# Patient Record
Sex: Female | Born: 1935 | Race: White | Hispanic: No | Marital: Single
Health system: Southern US, Community
[De-identification: ages and names within clinical notes are randomized; demographics above are authoritative.]

## PROBLEM LIST (undated history)

## (undated) DIAGNOSIS — E119 Type 2 diabetes mellitus without complications: Secondary | ICD-10-CM

## (undated) DIAGNOSIS — E079 Disorder of thyroid, unspecified: Secondary | ICD-10-CM

---

## 2007-11-23 ENCOUNTER — Emergency Department: Payer: Self-pay | Admitting: Emergency Medicine

## 2007-12-06 ENCOUNTER — Emergency Department: Payer: Self-pay | Admitting: Unknown Physician Specialty

## 2009-02-14 ENCOUNTER — Emergency Department: Payer: Self-pay | Admitting: Emergency Medicine

## 2010-01-11 ENCOUNTER — Emergency Department: Payer: Self-pay

## 2010-01-12 ENCOUNTER — Observation Stay: Payer: Self-pay | Admitting: Internal Medicine

## 2010-01-29 ENCOUNTER — Ambulatory Visit: Payer: Self-pay | Admitting: Gastroenterology

## 2011-06-25 ENCOUNTER — Emergency Department: Payer: Self-pay | Admitting: *Deleted

## 2013-05-27 ENCOUNTER — Emergency Department: Payer: Self-pay | Admitting: Emergency Medicine

## 2013-05-27 LAB — URINALYSIS, COMPLETE
Bilirubin,UR: NEGATIVE
Glucose,UR: NEGATIVE mg/dL (ref 0–75)
Ph: 5 (ref 4.5–8.0)
Protein: NEGATIVE
Specific Gravity: 1.011 (ref 1.003–1.030)
Squamous Epithelial: 2
WBC UR: 19 /HPF (ref 0–5)

## 2013-05-27 LAB — COMPREHENSIVE METABOLIC PANEL
Albumin: 3.1 g/dL — ABNORMAL LOW (ref 3.4–5.0)
Alkaline Phosphatase: 111 U/L (ref 50–136)
Anion Gap: 7 (ref 7–16)
BUN: 15 mg/dL (ref 7–18)
Bilirubin,Total: 0.7 mg/dL (ref 0.2–1.0)
Calcium, Total: 9.3 mg/dL (ref 8.5–10.1)
Chloride: 105 mmol/L (ref 98–107)
Creatinine: 1.07 mg/dL (ref 0.60–1.30)
EGFR (Non-African Amer.): 50 — ABNORMAL LOW
Osmolality: 276 (ref 275–301)
Potassium: 3.9 mmol/L (ref 3.5–5.1)
SGPT (ALT): 26 U/L (ref 12–78)
Sodium: 137 mmol/L (ref 136–145)
Total Protein: 7.1 g/dL (ref 6.4–8.2)

## 2013-05-27 LAB — CBC
HGB: 14.4 g/dL (ref 12.0–16.0)
MCH: 28.2 pg (ref 26.0–34.0)
MCV: 84 fL (ref 80–100)
Platelet: 205 10*3/uL (ref 150–440)
RBC: 5.08 10*6/uL (ref 3.80–5.20)
RDW: 13.5 % (ref 11.5–14.5)

## 2013-05-27 LAB — TROPONIN I: Troponin-I: <0.02 ng/mL

## 2013-05-27 LAB — TSH: Thyroid Stimulating Horm: 4.79 u[IU]/mL — ABNORMAL HIGH

## 2013-05-27 LAB — PRO B NATRIURETIC PEPTIDE: B-Type Natriuretic Peptide: 154 pg/mL (ref 0–450)

## 2013-07-31 ENCOUNTER — Emergency Department: Payer: Self-pay | Admitting: Emergency Medicine

## 2013-07-31 LAB — URINALYSIS, COMPLETE
Bilirubin,UR: NEGATIVE
Glucose,UR: NEGATIVE mg/dL (ref 0–75)
Ph: 6 (ref 4.5–8.0)
Protein: NEGATIVE
RBC,UR: 9 /HPF (ref 0–5)
Squamous Epithelial: 1

## 2013-08-01 LAB — URINE CULTURE

## 2014-07-30 IMAGING — CR DG CHEST 2V
1 series · 2 of 2 positions shown · non-contrast
Comparison: none

REASON FOR EXAM: weakness, shortness of breath
COMMENTS:

[Series 1: pa · 0.17mm/px · 2 of 2 slices shown]
[im 1/2]
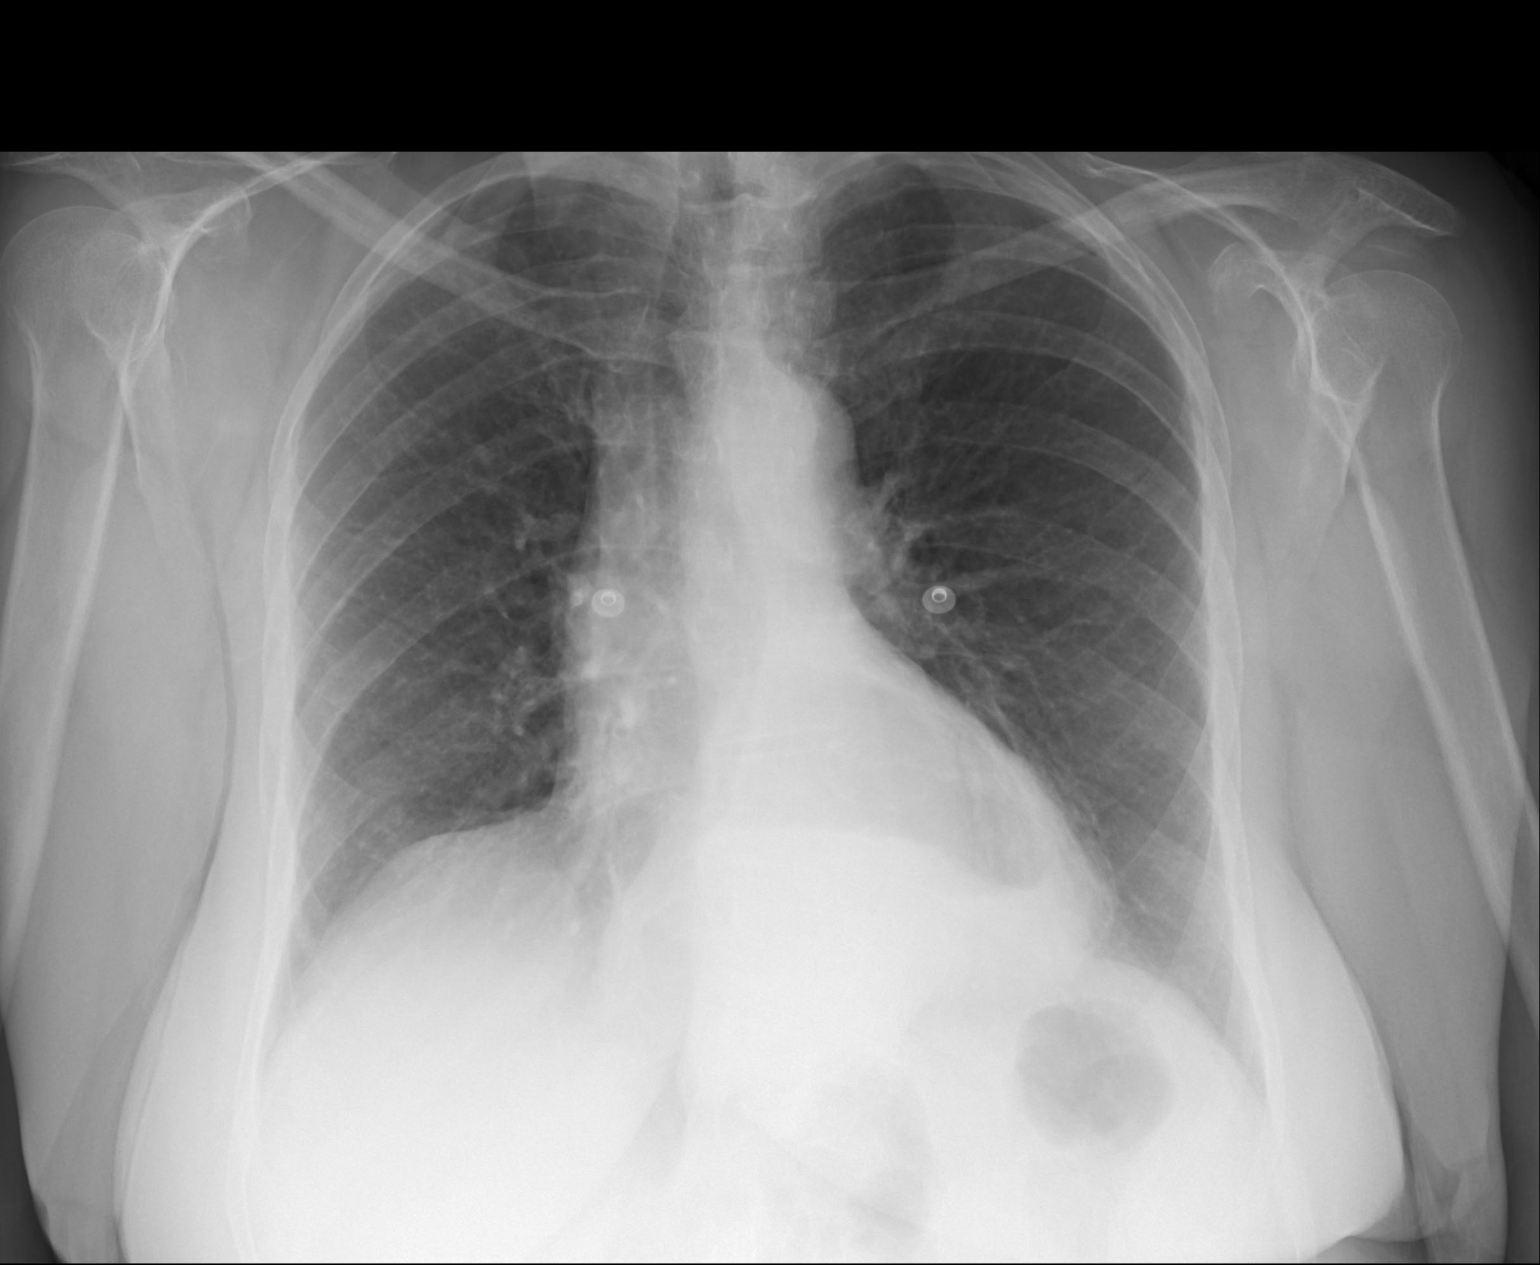
[im 2/2]
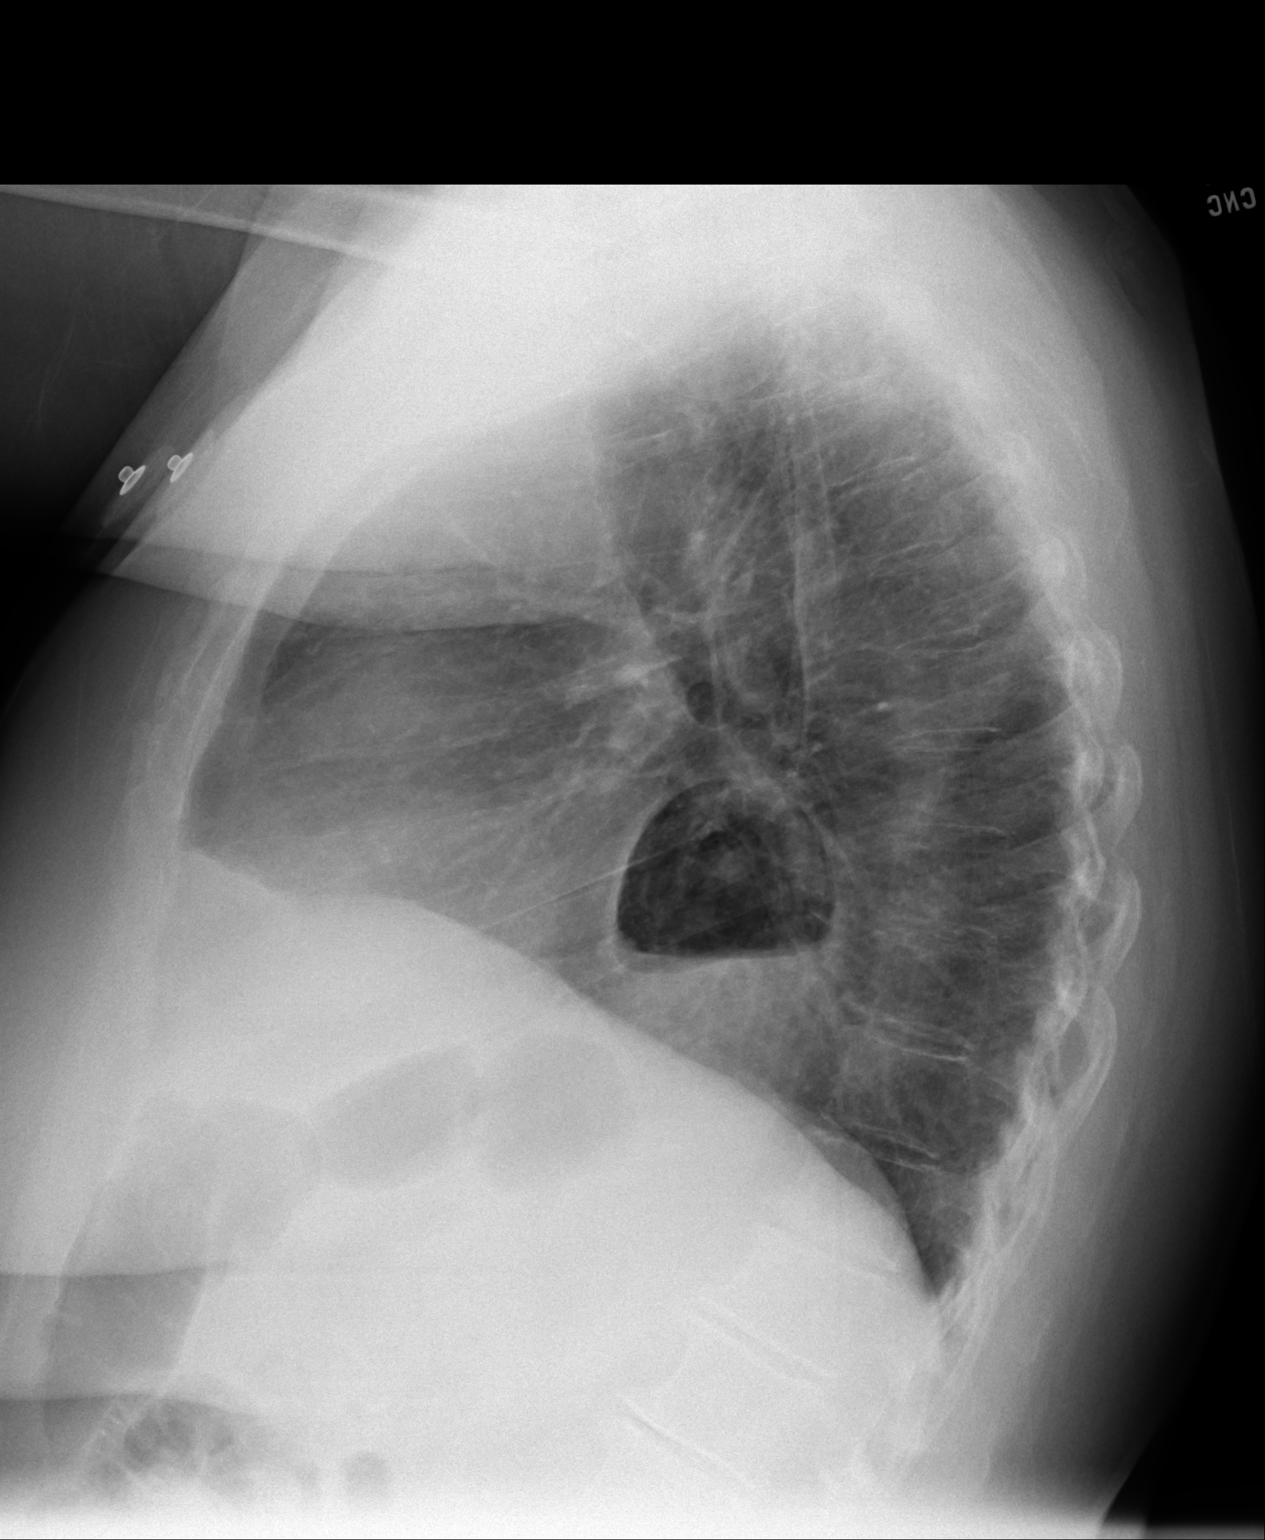

[2 of 2 positions shown; findings below may reference images not displayed]

PROCEDURE:     DXR - DXR CHEST PA (OR AP) AND LATERAL  - May 27, 2013  [DATE]

RESULT:     There is a moderately large hiatal hernia. The lungs are clear.
The heart and pulmonary vessels are normal. The bony and mediastinal
structures are unremarkable. There is no effusion. There is no pneumothorax
or evidence of congestive failure.
IMPRESSION: No acute cardiopulmonary disease. Moderately large hiatal
hernia.

[REDACTED]

## 2016-01-21 ENCOUNTER — Emergency Department
Admission: EM | Admit: 2016-01-21 | Discharge: 2016-01-21 | Disposition: A | Discharge status: Home / Self Care | Payer: Medicare Other | Attending: Emergency Medicine | Admitting: Emergency Medicine

## 2016-01-21 ENCOUNTER — Encounter: Payer: Self-pay | Admitting: Medical Oncology

## 2016-01-21 DIAGNOSIS — N390 Urinary tract infection, site not specified: Secondary | ICD-10-CM | POA: Diagnosis present

## 2016-01-21 DIAGNOSIS — E119 Type 2 diabetes mellitus without complications: Secondary | ICD-10-CM | POA: Diagnosis not present

## 2016-01-21 HISTORY — DX: Type 2 diabetes mellitus without complications: E11.9

## 2016-01-21 HISTORY — DX: Disorder of thyroid, unspecified: E07.9

## 2016-01-21 LAB — URINALYSIS COMPLETE WITH MICROSCOPIC (ARMC ONLY)
Bacteria, UA: NONE SEEN
Bilirubin Urine: NEGATIVE
GLUCOSE, UA: NEGATIVE mg/dL
Ketones, ur: NEGATIVE mg/dL
Nitrite: NEGATIVE
Protein, ur: NEGATIVE mg/dL
SPECIFIC GRAVITY, URINE: 1.001 — AB (ref 1.005–1.030)
SQUAMOUS EPITHELIAL / LPF: NONE SEEN
pH: 6 (ref 5.0–8.0)

## 2016-01-21 MED ORDER — NITROFURANTOIN MONOHYD MACRO 100 MG PO CAPS: 100.0000 mg | ORAL_CAPSULE | Freq: Two times a day (BID) | ORAL | Status: AC

## 2016-01-21 NOTE — ED Provider Notes (Signed)
[facility] Emergency Department Provider Note  ____________________________________________  Time seen: Approximately 10:07 AM  I have reviewed the triage vital signs and the nursing notes.   HISTORY  Chief Complaint Urinary Tract Infection    HPI Linda Camacho is a 82 y.o. female , NAD, presents to the emergency department with one-day history of dysuria and lower abdominal pressure with urination only. States she has had UTIs in the past and they typically begin this way. Has not noted any blood in her urine. Denies any abdominal pain other than the pressure that occurs when she urinates. Has noted increased urinary frequency. Has not had any urinary hesitancy or urgency. Denies any fevers, chills, body aches, numbness, weakness, tingling. Has not had chest pain, shortness breath. Does note some mild lower back ache but no radiation of the pain. Last urinary tract infection was approximately one year ago but unfortunately she does not remember the medication that she took at that time.   Past Medical History  Diagnosis Date  . Diabetes mellitus without complication (HCC)   . Thyroid disease     There are no active problems to display for this patient.   No past surgical history on file.  Current Outpatient Rx  Name  Route  Sig  Dispense  Refill  . glipiZIDE (GLUCOTROL) 10 MG tablet   Oral   Take 10 mg by mouth daily before breakfast.         . levothyroxine (SYNTHROID, LEVOTHROID) 50 MCG tablet   Oral   Take 50 mcg by mouth daily before breakfast.         . nitrofurantoin, macrocrystal-monohydrate, (MACROBID) 100 MG capsule   Oral   Take 1 capsule (100 mg total) by mouth 2 (two) times daily.   14 capsule   0     Allergies Sulfa antibiotics  No family history on file.  Social History Social History  Substance Use Topics  . Smoking status: None  . Smokeless tobacco: None  . Alcohol Use: None     Review of Systems   Constitutional: No fever/chills, fatigue Eyes: No visual changes. Cardiovascular: No chest pain. Respiratory: No cough. No shortness of breath. No wheezing.  Gastrointestinal: Positive lower abdominal pressure with urination only.  No nausea, vomiting.  No diarrhea.  No constipation. Genitourinary: Positive for dysuria and increased urinary frequency. No hematuria, urinary hesitancy, urgency. No pelvic pain Musculoskeletal: Positive for back ache without radiation of pain.  Skin: Negative for rash, redness, swelling, open wounds or skin sores. Neurological: Negative for headaches, focal weakness or numbness. No tingling 10-point ROS otherwise negative.  ____________________________________________   PHYSICAL EXAM:  VITAL SIGNS: ED Triage Vitals  Enc Vitals Group     BP 01/21/16 0954 136/89 mmHg     Pulse Rate 01/21/16 0954 84     Resp 01/21/16 0954 18     Temp 01/21/16 0954 98 F (36.7 C)     Temp Source 01/21/16 0954 Oral     SpO2 01/21/16 0954 97 %     Weight 01/21/16 0954 200 lb (90.719 kg)     Height 01/21/16 0954  (1.702 m)     Head Cir --      Peak Flow --      Pain Score 01/21/16 0955 0     Pain Loc --      Pain Edu? --      Excl. in GC? --      Constitutional: Alert and oriented. Well appearing  and in no acute distress. Eyes: Conjunctivae are normal.  Head: Atraumatic. Neck:Supple with full range of motion. Hematological/Lymphatic/Immunilogical: No cervical lymphadenopathy. Cardiovascular: Normal rate, regular rhythm. Grossly normal heart sounds.  Good peripheral circulation. Respiratory: Normal respiratory effort without tachypnea or retractions. Lungs CTAB with breath sounds noted in all lung fields. Gastrointestinal: Soft and nontender without distention or guarding in all quadrants. Grossly normal bowel sounds in all quadrants. No CVA tenderness. Musculoskeletal: No lower extremity tenderness nor edema.  No joint effusions. Neurologic:  Normal speech  and language. No gross focal neurologic deficits are appreciated. Gait and posture are normal. Skin:  Skin is warm, dry and intact. No rash noted. Psychiatric: Mood and affect are normal. Speech and behavior are normal. Patient exhibits appropriate insight and judgement.   ____________________________________________   LABS (all labs ordered are listed, but only abnormal results are displayed)  Labs Reviewed  URINALYSIS COMPLETEWITH MICROSCOPIC ([facility] ONLY) - Abnormal; Notable for the following:    Color, Urine COLORLESS (*)    APPearance CLEAR (*)    Specific Gravity, Urine 1.001 (*)    Hgb urine dipstick 2+ (*)    Leukocytes, UA 2+ (*)    All other components within normal limits  URINE CULTURE   ____________________________________________  EKG  None ____________________________________________  RADIOLOGY  None ____________________________________________    PROCEDURES  Procedure(s) performed: None   Medications - No data to display   ____________________________________________   INITIAL IMPRESSION / ASSESSMENT AND PLAN / ED COURSE  Pertinent lab results that were available during my care of the patient were reviewed by me and considered in my medical decision making (see chart for details). Urine culture was not available at the time of discharge but results will be called to the patient when available.  Patient's diagnosis is consistent with acute lower urinary tract infection. Patient will be discharged home with prescriptions for Macrobid to take as directed. Patient is to follow up with her primary care provider in 48 hours if symptoms persist past this treatment course. Patient is given ED precautions to return to the ED for any worsening or new symptoms.    ____________________________________________  FINAL CLINICAL IMPRESSION(S) / ED DIAGNOSES  Final diagnoses:  Lower urinary tract infection      NEW MEDICATIONS STARTED DURING THIS  VISIT:  New Prescriptions   NITROFURANTOIN, MACROCRYSTAL-MONOHYDRATE, (MACROBID) 100 MG CAPSULE    Take 1 capsule (100 mg total) by mouth 2 (two) times daily.         Hope PigeonJami L Hagler, PA-C 01/21/16 1054  Myrna Blazeravid Matthew Schaevitz, MD 01/21/16 [phone]

## 2016-01-21 NOTE — Discharge Instructions (Signed)

## 2016-01-21 NOTE — ED Notes (Signed)
Pt ambulatory to triage with reports of pain with urination and pressure in lower abd with urination. Sx's began yesterday. Pt AO x 4 in NAD. Has had uti in the past and feels same.

## 2016-01-22 LAB — URINE CULTURE: Special Requests: NORMAL
